# Patient Record
Sex: Female | Born: 1980 | Race: White | Hispanic: Yes | Marital: Single | Smoking: Never smoker
Health system: Southern US, Community
[De-identification: ages and names within clinical notes are randomized; demographics above are authoritative.]

## PROBLEM LIST (undated history)

## (undated) HISTORY — PX: NO PAST SURGERIES: SHX2092

---

## 2007-09-11 ENCOUNTER — Ambulatory Visit: Payer: Self-pay | Admitting: Obstetrics & Gynecology

## 2010-12-29 LAB — POCT PREGNANCY, URINE: Preg Test, Ur: NEGATIVE

## 2013-07-15 ENCOUNTER — Encounter (HOSPITAL_COMMUNITY): Payer: Self-pay | Admitting: Emergency Medicine

## 2013-07-15 ENCOUNTER — Emergency Department (HOSPITAL_COMMUNITY)
Admission: EM | Admit: 2013-07-15 | Discharge: 2013-07-15 | Disposition: A | Discharge status: Home / Self Care | Payer: Self-pay | Attending: Emergency Medicine | Admitting: Emergency Medicine

## 2013-07-15 DIAGNOSIS — J029 Acute pharyngitis, unspecified: Secondary | ICD-10-CM | POA: Insufficient documentation

## 2013-07-15 LAB — RAPID STREP SCREEN (MED CTR MEBANE ONLY): STREPTOCOCCUS, GROUP A SCREEN (DIRECT): NEGATIVE

## 2013-07-15 MED ORDER — DEXTROMETHORPHAN POLISTIREX 30 MG/5ML PO LQCR: 30.0000 mg | ORAL | Status: DC | PRN

## 2013-07-15 MED ORDER — DIPHENHYDRAMINE HCL 25 MG PO TABS: 25.0000 mg | ORAL_TABLET | Freq: Four times a day (QID) | ORAL | Status: DC | PRN

## 2013-07-15 NOTE — ED Provider Notes (Signed)
CSN: [id number]     Arrival date & time 07/15/13  1040 History  This chart was scribed for non-physician practitioner Emilia BeckKaitlyn Jamyah Folk, PA-C working with Hilario Quarryanielle S Ray, MD by Leone PayorSonum Patel, ED Scribe. This patient was seen in room TR10C/TR10C and the patient's care was started at 11:08 AM.      Chief Complaint  Patient presents with  . Sore Throat  . Fever      HPI  HPI Comments: Windy Fastna Maria Garcia-Tzep is a 33 y.o. female who presents to the Emergency Department complaining of 2 weeks of gradual onset, gradually worsening, constant sore throat with associated subjective fever and cough. She reports upper abdominal pain only when coughing. She denies sick contacts. She denies nausea, vomiting, diarrhea.   History reviewed. No pertinent past medical history. History reviewed. No pertinent past surgical history. No family history on file. History  Substance Use Topics  . Smoking status: Never Smoker   . Smokeless tobacco: Not on file  . Alcohol Use: No   OB History   Grav Para Term Preterm Abortions TAB SAB Ect Mult Living                 Review of Systems  Constitutional: Positive for fever (subjective).  HENT: Positive for sore throat. Negative for trouble swallowing.   Respiratory: Positive for cough.       Allergies  Review of patient's allergies indicates no known allergies.  Home Medications   Prior to Admission medications   Not on File   BP 105/67  Pulse 79  Temp(Src) 97.5 F (36.4 C) (Oral)  Resp 18  SpO2 98%  LMP 07/04/2013 Physical Exam  Nursing note and vitals reviewed. Constitutional: She is oriented to person, place, and time. She appears well-developed and well-nourished.  HENT:  Head: Normocephalic and atraumatic.  Mouth/Throat: No oropharyngeal exudate, posterior oropharyngeal edema or posterior oropharyngeal erythema.  Cardiovascular: Normal rate.   Pulmonary/Chest: Effort normal.  Abdominal: She exhibits no distension.  Lymphadenopathy:   She has cervical adenopathy.  Neurological: She is alert and oriented to person, place, and time.  Skin: Skin is warm and dry.  Psychiatric: She has a normal mood and affect.    ED Course  Procedures (including critical care time)  DIAGNOSTIC STUDIES: Oxygen Saturation is 98% on RA, normal by my interpretation.    COORDINATION OF CARE: 11:09 AM Will test for strep throat. Discussed treatment plan with pt at bedside and pt agreed to plan.   11:40 AM Strep test is negative. Will discharge home with Benadryl.   Labs Review Labs Reviewed  RAPID STREP SCREEN  CULTURE, GROUP A STREP    Imaging Review No results found.   EKG Interpretation None      MDM   Final diagnoses:  Sore throat    11:37 AM Patient's rapid strep is negative. Patient is afebrile and remaining vitals stable. Patient likely having post nasal drip that is irritating her throat. Patient will be discharged with benadryl. Vitals stable and patient afebrile.   I personally performed the services described in this documentation, which was scribed in my presence. The recorded information has been reviewed and is accurate.      Emilia BeckKaitlyn Sadrac Zeoli, PA-C 07/15/13 1142

## 2013-07-15 NOTE — Discharge Instructions (Signed)
Take benadryl for congestion. Take delsym for cough. Refer to attached documents for more information.   Tome benadryl para la congestin . Tome Delsym para la tos . Consulte los documentos adjuntos para ms informacin.

## 2013-07-15 NOTE — ED Notes (Signed)
Pt is here with sorethroat for 2 weeks and states that hard to sleep and has fever.  Pt now not wanting to eat or drink.  Pt states cough and sore in upper abdomen with coughing.  No vomiting or diarrhea

## 2013-07-16 NOTE — ED Provider Notes (Signed)
History/physical exam/procedure(s) were performed by non-physician practitioner and as supervising physician I was immediately available for consultation/collaboration. I have reviewed all notes and am in agreement with care and plan.   Leyana Whidden S Merek Niu, MD 07/16/13 1130 

## 2013-07-17 LAB — CULTURE, GROUP A STREP

## 2019-09-07 ENCOUNTER — Inpatient Hospital Stay (HOSPITAL_COMMUNITY)
Admission: EM | Admit: 2019-09-07 | Discharge: 2019-09-08 | Disposition: A | Discharge status: Home / Self Care | Payer: Self-pay | Attending: Obstetrics and Gynecology | Admitting: Obstetrics and Gynecology

## 2019-09-07 ENCOUNTER — Other Ambulatory Visit: Payer: Self-pay

## 2019-09-07 DIAGNOSIS — O209 Hemorrhage in early pregnancy, unspecified: Secondary | ICD-10-CM | POA: Insufficient documentation

## 2019-09-07 DIAGNOSIS — O30031 Twin pregnancy, monochorionic/diamniotic, first trimester: Secondary | ICD-10-CM | POA: Insufficient documentation

## 2019-09-07 DIAGNOSIS — N939 Abnormal uterine and vaginal bleeding, unspecified: Secondary | ICD-10-CM

## 2019-09-07 DIAGNOSIS — O34219 Maternal care for unspecified type scar from previous cesarean delivery: Secondary | ICD-10-CM | POA: Insufficient documentation

## 2019-09-07 DIAGNOSIS — O09521 Supervision of elderly multigravida, first trimester: Secondary | ICD-10-CM | POA: Insufficient documentation

## 2019-09-07 DIAGNOSIS — O3680X2 Pregnancy with inconclusive fetal viability, fetus 2: Secondary | ICD-10-CM | POA: Insufficient documentation

## 2019-09-07 DIAGNOSIS — O3680X1 Pregnancy with inconclusive fetal viability, fetus 1: Secondary | ICD-10-CM | POA: Insufficient documentation

## 2019-09-07 DIAGNOSIS — O4690 Antepartum hemorrhage, unspecified, unspecified trimester: Secondary | ICD-10-CM

## 2019-09-07 DIAGNOSIS — Z3A01 Less than 8 weeks gestation of pregnancy: Secondary | ICD-10-CM | POA: Insufficient documentation

## 2019-09-07 LAB — COMPREHENSIVE METABOLIC PANEL WITH GFR
ALT: 23 U/L (ref 0–44)
AST: 22 U/L (ref 15–41)
Albumin: 3.8 g/dL (ref 3.5–5.0)
Alkaline Phosphatase: 67 U/L (ref 38–126)
Anion gap: 9 (ref 5–15)
BUN: 11 mg/dL (ref 6–20)
CO2: 24 mmol/L (ref 22–32)
Calcium: 9 mg/dL (ref 8.9–10.3)
Chloride: 104 mmol/L (ref 98–111)
Creatinine, Ser: 0.64 mg/dL (ref 0.44–1.00)
GFR calc Af Amer: >60 mL/min
GFR calc non Af Amer: >60 mL/min
Glucose, Bld: 98 mg/dL (ref 70–99)
Potassium: 3.8 mmol/L (ref 3.5–5.1)
Sodium: 137 mmol/L (ref 135–145)
Total Bilirubin: 0.2 mg/dL — ABNORMAL LOW (ref 0.3–1.2)
Total Protein: 7 g/dL (ref 6.5–8.1)

## 2019-09-07 LAB — CBC
HCT: 39.4 % (ref 36.0–46.0)
Hemoglobin: 13 g/dL (ref 12.0–15.0)
MCH: 31.3 pg (ref 26.0–34.0)
MCHC: 33 g/dL (ref 30.0–36.0)
MCV: 94.9 fL (ref 80.0–100.0)
Platelets: 267 10*3/uL (ref 150–400)
RBC: 4.15 MIL/uL (ref 3.87–5.11)
RDW: 14 % (ref 11.5–15.5)
WBC: 10.2 10*3/uL (ref 4.0–10.5)
nRBC: 0 % (ref 0.0–0.2)

## 2019-09-07 LAB — HCG, QUANTITATIVE, PREGNANCY: hCG, Beta Chain, Quant, S: 14838 m[IU]/mL — ABNORMAL HIGH (ref <5)

## 2019-09-07 NOTE — ED Provider Notes (Signed)
MSE was initiated and I personally evaluated the patient and placed orders (if any) at  11:46 PM on September 07, 2019.  The patient appears stable so that the remainder of the MSE may be completed by another provider.  Cynthia York is a 39 y.o. female G3P1 approx 10W presents for lower abd cramping and vaginal bleeding onset this morning.  Has not yet established with OB/GYN.  Has an appointment with Dr. Shea Evans tomorrow.    Face to face Exam:   General: Awake  HEENT: Atraumatic  Resp: Normal effort  Abd: Nondistended, tender in the suprapubic region    BP 105/73 (BP Location: Left Arm)   Pulse 73   Temp 98.4 F (36.9 C) (Oral)   Resp 18   SpO2 99%    11:50 PM Discussed with [name], MAU APP who accepts in transfer.    Vaginal bleeding in pregnancy       [name] 09/07/19 2350    Zadie Rhine, MD 09/08/19 [phone]

## 2019-09-07 NOTE — ED Triage Notes (Signed)
Pt reports she is [redacted] weeks pregnant, started having some vaginal bleeding one week ago.  Yesterday she began having heavy bleeding (with clotting).  Today she continues to have heavy bleeding accompanied w/ abdominal cramping and back pain.  Last cycle started on March 28, first Dr. Appointment is scheduled for tomorrow.

## 2019-09-08 ENCOUNTER — Encounter (HOSPITAL_COMMUNITY): Payer: Self-pay

## 2019-09-08 ENCOUNTER — Inpatient Hospital Stay (HOSPITAL_COMMUNITY): Payer: Self-pay

## 2019-09-08 DIAGNOSIS — N939 Abnormal uterine and vaginal bleeding, unspecified: Secondary | ICD-10-CM

## 2019-09-08 DIAGNOSIS — O3680X2 Pregnancy with inconclusive fetal viability, fetus 2: Secondary | ICD-10-CM

## 2019-09-08 DIAGNOSIS — O30031 Twin pregnancy, monochorionic/diamniotic, first trimester: Secondary | ICD-10-CM

## 2019-09-08 DIAGNOSIS — O4690 Antepartum hemorrhage, unspecified, unspecified trimester: Secondary | ICD-10-CM

## 2019-09-08 DIAGNOSIS — Z3A10 10 weeks gestation of pregnancy: Secondary | ICD-10-CM

## 2019-09-08 DIAGNOSIS — O3680X1 Pregnancy with inconclusive fetal viability, fetus 1: Secondary | ICD-10-CM

## 2019-09-08 LAB — ABO/RH: ABO/RH(D): O POS

## 2019-09-08 LAB — WET PREP, GENITAL
Clue Cells Wet Prep HPF POC: NONE SEEN
Sperm: NONE SEEN
Trich, Wet Prep: NONE SEEN
Yeast Wet Prep HPF POC: NONE SEEN

## 2019-09-08 LAB — GC/CHLAMYDIA PROBE AMP (~~LOC~~) NOT AT ARMC
Chlamydia: NEGATIVE
Comment: NEGATIVE
Comment: NORMAL
Neisseria Gonorrhea: NEGATIVE

## 2019-09-08 NOTE — MAU Note (Signed)
Had some spotting for 1 day last week.  Today started having a moderate amount of bleeding with clots, also having abdominal pain that feels like period cramps.  Pain is more on the left side of her abdomen.

## 2019-09-08 NOTE — MAU Provider Note (Signed)
History     CSN: [id number]  Arrival date and time: 09/07/19 1932   First Provider Initiated Contact with Patient 09/08/19 0148      Chief Complaint  Patient presents with  . Vaginal Bleeding  . abdominal bleeding - [redacted] weeks pregnant   Cynthia York is a 39 y.o. G3P2 at [redacted]w[redacted]d by LMP who presents to MAU with complaints of vaginal bleeding and abdominal pain. Patient reports that symptoms started occurring this morning. Describes the abdominal pain as lower abdominal cramping that is specific to left lower quadrant. Rates pain 5/10 - has not taken any medication for abdominal pain. She describes vaginal bleeding as bright red spotting when she wipes, she denies having to wear a pad or panty liner for bleeding. She is scheduled to see Dr Micah Noel for initial visit this afternoon.    OB History    Gravida  3   Para  2   Term  2   Preterm      AB      Living  2     SAB      TAB      Ectopic      Multiple      Live Births  2           History reviewed. No pertinent past medical history.  Past Surgical History:  Procedure Laterality Date  . CESAREAN SECTION      No family history on file.  Social History   Tobacco Use  . Smoking status: Never Smoker  Substance Use Topics  . Alcohol use: No  . Drug use: No    Allergies: No Known Allergies  Medications Prior to Admission  Medication Sig Dispense Refill Last Dose  . dextromethorphan (DELSYM) 30 MG/5ML liquid Take 5 mLs (30 mg total) by mouth as needed for cough. 89 mL 0   . diphenhydrAMINE (BENADRYL) 25 MG tablet Take 1 tablet (25 mg total) by mouth every 6 (six) hours as needed for itching (Rash). 30 tablet 0     Review of Systems  Constitutional: Negative.   Respiratory: Negative.   Cardiovascular: Negative.   Gastrointestinal: Positive for abdominal pain. Negative for constipation, diarrhea, nausea and vomiting.  Genitourinary: Positive for vaginal bleeding. Negative for difficulty urinating,  dysuria, frequency, pelvic pain and urgency.  Musculoskeletal: Negative.   Neurological: Negative.   Psychiatric/Behavioral: Negative.    Physical Exam   Blood pressure 103/71, pulse 92, temperature 99.2 F (37.3 C), temperature source Oral, resp. rate 19, last menstrual period 06/29/2019, SpO2 100 %.  Physical Exam  Nursing note and vitals reviewed. Constitutional: She is oriented to person, place, and time. She appears well-developed and well-nourished. No distress.  Cardiovascular: Normal rate and regular rhythm.  Respiratory: Effort normal and breath sounds normal. No respiratory distress. She has no wheezes.  GI: Soft. She exhibits no distension. There is abdominal tenderness. There is guarding. There is no rebound.  Genitourinary:    Vaginal bleeding present.  There is bleeding in the vagina.    Genitourinary Comments: Pelvic exam: Cervix pink, visually closed, without lesion, small amount of bright red mucousy vaginal bleeding present from cervical os, vaginal walls and external genitalia normal Bimanual exam: Cervix 0/long/high, firm, anterior, neg CMT, uterus nontender, nonenlarged, right adnexa without tenderness, enlargement, or mass,  Left adnexa with extreme tenderness with palpation, no enlargement, or mass palpated    Musculoskeletal:        General: No edema. Normal range of motion.  Neurological:  She is alert and oriented to person, place, and time.  Psychiatric: She has a normal mood and affect. Her behavior is normal. Thought content normal.    MAU Course  Procedures  MDM Orders Placed This Encounter  Procedures  . Wet prep, genital  . US OB LESS THAN 14 WEEKS WITH OB TRANSVAGINAL  . US OB Comp AddL Gest Less 14 Wks  . US OB Transvaginal  . hCG, quantitative, pregnancy  . Comprehensive metabolic panel  . CBC  . Diet NPO time specified  . ABO/Rh   Labs and [place] report reviewed:  Results for orders placed or performed during the hospital encounter of 09/07/19  (from the past 24 hour(s))  hCG, quantitative, pregnancy     Status: Abnormal   Collection Time: 09/07/19  8:52 PM  Result Value Ref Range   hCG, Beta Chain, Quant, S 14,838 (H) <5 mIU/mL  Comprehensive metabolic panel     Status: Abnormal   Collection Time: 09/07/19  8:53 PM  Result Value Ref Range   Sodium 137 135 - 145 mmol/L   Potassium 3.8 3.5 - 5.1 mmol/L   Chloride 104 98 - 111 mmol/L   CO2 24 22 - 32 mmol/L   Glucose, Bld 98 70 - 99 mg/dL   BUN 11 6 - 20 mg/dL   Creatinine, Ser 6.54 0.44 - 1.00 mg/dL   Calcium 9.0 8.9 - 65.0 mg/dL   Total Protein 7.0 6.5 - 8.1 g/dL   Albumin 3.8 3.5 - 5.0 g/dL   AST 22 15 - 41 U/L   ALT 23 0 - 44 U/L   Alkaline Phosphatase 67 38 - 126 U/L   Total Bilirubin 0.2 (L) 0.3 - 1.2 mg/dL   GFR calc non Af Amer >60 >60 mL/min   GFR calc Af Amer >60 >60 mL/min   Anion gap 9 5 - 15  CBC     Status: None   Collection Time: 09/07/19  8:53 PM  Result Value Ref Range   WBC 10.2 4.0 - 10.5 K/uL   RBC 4.15 3.87 - 5.11 MIL/uL   Hemoglobin 13.0 12.0 - 15.0 g/dL   HCT 35.4 65.6 - 81.2 %   MCV 94.9 80.0 - 100.0 fL   MCH 31.3 26.0 - 34.0 pg   MCHC 33.0 30.0 - 36.0 g/dL   RDW 75.1 70.0 - 17.4 %   Platelets 267 150 - 400 K/uL   nRBC 0.0 0.0 - 0.2 %  ABO/Rh     Status: None   Collection Time: 09/08/19 12:35 AM  Result Value Ref Range   ABO/RH(D) O POS    No rh immune globuloin      NOT A RH IMMUNE GLOBULIN CANDIDATE, PT RH POSITIVE Performed at [facility], [address] [place], [place] [address]   Wet prep, genital     Status: Abnormal   Collection Time: 09/08/19  1:58 AM  Result Value Ref Range   Yeast Wet Prep HPF POC NONE SEEN NONE SEEN   Trich, Wet Prep NONE SEEN NONE SEEN   Clue Cells Wet Prep HPF POC NONE SEEN NONE SEEN   WBC, Wet Prep HPF POC MANY (A) NONE SEEN   Sperm NONE SEEN    US OB Comp AddL Gest Less 14 Wks  Result Date: 09/08/2019 CLINICAL DATA:  Pregnant patient in first-trimester pregnancy with vaginal spotting.  EXAM: TWIN OBSTETRIC <14WK [place] AND TRANSVAGINAL OB US COMPARISON:  None. FINDINGS: Number of IUPs:  2 Chorionicity/Amnionicity:  Monochorionic-diamniotic (thin membrane) TWIN 1 Yolk sac:  Visualized. Embryo:  Visualized. Cardiac Activity: Not Visualized. CRL:  2.6 mm   5 w 5 d                  [place] EDC: 05/05/2020 TWIN 2 Yolk sac:  Visualized. Embryo:  Visualized. Cardiac Activity: Not Visualized. CRL:  2.8 mm   5 w 5 d                  [place] EDC: 05/05/2020 Subchorionic hemorrhage:  None visualized. Maternal uterus/adnexae: The right ovary is normal. The left ovary is not seen. There is no pelvic free fluid or adnexal mass. IMPRESSION: 1. Twin intrauterine pregnancy (likely mono chorionic diamniotic) with 2 yolk sacs and fetal poles. No fetal cardiac activity detected within either fetal pole. Crown-rump length of 2.6 and 2.8 cm. Findings are suspicious but not yet definitive for failed pregnancy. Recommend follow-up US in 10-14 days for definitive diagnosis. This recommendation follows SRU consensus guidelines: Diagnostic Criteria for Nonviable Pregnancy Early in the First Trimester. Malva Limes Med 2013; 315:4008-67. 2. No subchorionic hemorrhage. Electronically Signed   By: Narda Rutherford M.D.   On: 09/08/2019 01:52   US OB LESS THAN 14 WEEKS WITH OB TRANSVAGINAL  Result Date: 09/08/2019 CLINICAL DATA:  Pregnant patient in first-trimester pregnancy with vaginal spotting. EXAM: TWIN OBSTETRIC <14WK [place] AND TRANSVAGINAL OB US COMPARISON:  None. FINDINGS: Number of IUPs:  2 Chorionicity/Amnionicity:  Monochorionic-diamniotic (thin membrane) TWIN 1 Yolk sac:  Visualized. Embryo:  Visualized. Cardiac Activity: Not Visualized. CRL:  2.6 mm   5 w 5 d                  [place] EDC: 05/05/2020 TWIN 2 Yolk sac:  Visualized. Embryo:  Visualized. Cardiac Activity: Not Visualized. CRL:  2.8 mm   5 w 5 d                  [place] EDC: 05/05/2020 Subchorionic hemorrhage:  None visualized. Maternal uterus/adnexae: The right ovary is normal. The  left ovary is not seen. There is no pelvic free fluid or adnexal mass. IMPRESSION: 1. Twin intrauterine pregnancy (likely mono chorionic diamniotic) with 2 yolk sacs and fetal poles. No fetal cardiac activity detected within either fetal pole. Crown-rump length of 2.6 and 2.8 cm. Findings are suspicious but not yet definitive for failed pregnancy. Recommend follow-up US in 10-14 days for definitive diagnosis. This recommendation follows SRU consensus guidelines: Diagnostic Criteria for Nonviable Pregnancy Early in the First Trimester. Malva Limes Med 2013; 619:5093-26. 2. No subchorionic hemorrhage. Electronically Signed   By: Narda Rutherford M.D.   On: 09/08/2019 01:52   Discussed results of [place] and lab work with patient. Educated and discussed miscarriage precautions based on ultrasound and no SCH noted. Discussed importance of follow up US to assess viability, [place] ordered for scheduling. Discussed with patient changing of dates based on today's [place], [facility] 05/05/20.   Discussed reasons to return to MAU. Follow up as scheduled in the office. Return to MAU as needed. Pt stable at time of discharge. Miscarriage precautions.   Assessment and Plan   1. Vaginal bleeding in pregnancy   2. Vaginal spotting   3. Monochorionic diamniotic twin gestation in first trimester   4. Pregnancy with uncertain fetal viability, fetus 1 of multiple gestation   5. Pregnancy with uncertain fetal viability, fetus 2 of multiple gestation   6. [redacted] weeks gestation of pregnancy  Discharge home Follow up as scheduled in the office for prenatal care Return to MAU as needed for reasons discussed and/or emergencies  Miscarriage precautions   Follow-up Information    [facility] Follow up.   Specialty: Obstetrics and Gynecology Why: return to MAU as needed for reasons discussed  regresar a MAU segn sea necesario por las razones discutidas Contact information: [id number] [address] [phone]         Allergies as of 09/08/2019   No Known Allergies     Medication List    TAKE these medications   dextromethorphan 30 MG/5ML liquid Commonly known as: Delsym Take 5 mLs (30 mg total) by mouth as needed for cough.   diphenhydrAMINE 25 MG tablet Commonly known as: Benadryl Take 1 tablet (25 mg total) by mouth every 6 (six) hours as needed for itching (Rash).        Sharyon Cable CNM 09/08/2019, 2:04 AM

## 2019-09-15 ENCOUNTER — Other Ambulatory Visit: Payer: Self-pay

## 2019-09-15 ENCOUNTER — Inpatient Hospital Stay (HOSPITAL_COMMUNITY): Payer: Self-pay

## 2019-09-15 ENCOUNTER — Encounter (HOSPITAL_COMMUNITY): Payer: Self-pay | Admitting: *Deleted

## 2019-09-15 ENCOUNTER — Inpatient Hospital Stay (HOSPITAL_COMMUNITY)
Admission: AD | Admit: 2019-09-15 | Discharge: 2019-09-15 | Disposition: A | Discharge status: Home / Self Care | Payer: Self-pay | Attending: Obstetrics & Gynecology | Admitting: Obstetrics & Gynecology

## 2019-09-15 DIAGNOSIS — O034 Incomplete spontaneous abortion without complication: Secondary | ICD-10-CM

## 2019-09-15 DIAGNOSIS — O30001 Twin pregnancy, unspecified number of placenta and unspecified number of amniotic sacs, first trimester: Secondary | ICD-10-CM

## 2019-09-15 DIAGNOSIS — Z3A01 Less than 8 weeks gestation of pregnancy: Secondary | ICD-10-CM | POA: Insufficient documentation

## 2019-09-15 DIAGNOSIS — O09521 Supervision of elderly multigravida, first trimester: Secondary | ICD-10-CM | POA: Insufficient documentation

## 2019-09-15 LAB — CBC
HCT: 42 % (ref 36.0–46.0)
Hemoglobin: 14 g/dL (ref 12.0–15.0)
MCH: 31.2 pg (ref 26.0–34.0)
MCHC: 33.3 g/dL (ref 30.0–36.0)
MCV: 93.5 fL (ref 80.0–100.0)
Platelets: 251 10*3/uL (ref 150–400)
RBC: 4.49 MIL/uL (ref 3.87–5.11)
RDW: 13.6 % (ref 11.5–15.5)
WBC: 8.6 10*3/uL (ref 4.0–10.5)
nRBC: 0 % (ref 0.0–0.2)

## 2019-09-15 LAB — HCG, QUANTITATIVE, PREGNANCY: hCG, Beta Chain, Quant, S: 9111 m[IU]/mL — ABNORMAL HIGH (ref <5)

## 2019-09-15 MED ORDER — IBUPROFEN 600 MG PO TABS: 600.0000 mg | ORAL_TABLET | Freq: Four times a day (QID) | ORAL | 3 refills | Status: AC | PRN

## 2019-09-15 MED ORDER — HYDROMORPHONE HCL 1 MG/ML IJ SOLN
0.5000 mg | Freq: Once | INTRAMUSCULAR | Status: AC
  Administered 2019-09-15: 0.5 mg via INTRAVENOUS
  Filled 2019-09-15: qty 1

## 2019-09-15 MED ORDER — HYDROMORPHONE HCL 1 MG/ML IJ SOLN
1.0000 mg | Freq: Once | INTRAMUSCULAR | Status: AC
  Administered 2019-09-15: 1 mg via INTRAVENOUS
  Filled 2019-09-15: qty 1

## 2019-09-15 MED ORDER — OXYCODONE HCL 5 MG PO TABS: 5.0000 mg | ORAL_TABLET | Freq: Three times a day (TID) | ORAL | 0 refills | Status: AC | PRN

## 2019-09-15 MED ORDER — KETOROLAC TROMETHAMINE 30 MG/ML IJ SOLN
30.0000 mg | Freq: Once | INTRAMUSCULAR | Status: AC
  Administered 2019-09-15: 30 mg via INTRAVENOUS
  Filled 2019-09-15: qty 1

## 2019-09-15 MED ORDER — MISOPROSTOL 200 MCG PO TABS
800.0000 ug | ORAL_TABLET | Freq: Once | ORAL | Status: AC
  Administered 2019-09-15: 800 ug via ORAL
  Filled 2019-09-15: qty 4

## 2019-09-15 NOTE — ED Triage Notes (Signed)
The pt reports that she is pregnant and has been on bed rest  For several days tonight she had vaginal bleeding and  Much pain  Crying and moaning loudly  lmp march

## 2019-09-15 NOTE — ED Provider Notes (Signed)
MSE was initiated and I personally evaluated the patient and placed orders (if any) at  1:19 AM on September 15, 2019.  Patient is G3P2 here with severe lower abdominal pain and heavy vaginal bleeding that started tonight. Seen on 09/07/19 for low abdominal pain, had an [place] that showed twin pregnancy, no cardiac activity at that time, [redacted]w[redacted]d. Has not started prenatal care yet.   VS reported to me as normal, not yet documented.  I spoke to Dr. Despina Hidden at [facility] who accepts the patient for further care.   The patient appears stable so that the remainder of the MSE may be completed by another provider.   Elpidio Anis, PA-C 09/15/19 0121    Nira Conn, MD 09/16/19 0800

## 2019-09-15 NOTE — MAU Provider Note (Addendum)
History     CSN: [id number]  Arrival date and time: 09/15/19 2440   First Provider Initiated Contact with Patient 09/15/19 0201      Chief Complaint  Patient presents with   Vaginal Bleeding   HPI Cynthia York is a 39 y.o. G3P2002 at [redacted]w[redacted]d who presents from [facility] with vaginal bleeding and pain. She was seen in MAU on 6/7 and diagnosed with a twin gestation but no FHT noted in either embryo. She was scheduled for a follow up ultrasound this week. She states she started having severe abdominal pain and heavy vaginal bleeding. She rates the pain a 10/10 and has not tried anything for the pain. She reports passing large clots at home.  OB History     Gravida  3   Para  2   Term  2   Preterm      AB      Living  2      SAB      TAB      Ectopic      Multiple      Live Births  2           History reviewed. No pertinent past medical history.  Past Surgical History:  Procedure Laterality Date   CESAREAN SECTION      No family history on file.  Social History   Tobacco Use   Smoking status: Never Smoker   Smokeless tobacco: Never Used  Substance Use Topics   Alcohol use: No   Drug use: No    Allergies: No Known Allergies  Medications Prior to Admission  Medication Sig Dispense Refill Last Dose   dextromethorphan (DELSYM) 30 MG/5ML liquid Take 5 mLs (30 mg total) by mouth as needed for cough. 89 mL 0    diphenhydrAMINE (BENADRYL) 25 MG tablet Take 1 tablet (25 mg total) by mouth every 6 (six) hours as needed for itching (Rash). 30 tablet 0     Review of Systems  Constitutional: Negative.  Negative for fatigue and fever.  HENT: Negative.   Respiratory: Negative.  Negative for shortness of breath.   Cardiovascular: Negative.  Negative for chest pain.  Gastrointestinal: Positive for abdominal pain. Negative for constipation, diarrhea, nausea and vomiting.  Genitourinary: Positive for vaginal bleeding. Negative for dysuria.  Neurological:  Negative.  Negative for dizziness and headaches.   Physical Exam   Blood pressure (!) 111/56, pulse 73, temperature 98.1 F (36.7 C), temperature source Oral, resp. rate 20, height 4\' 11"  (1.499 m), weight 72.6 kg, last menstrual period 06/29/2019, SpO2 98 %.  Physical Exam  Nursing note and vitals reviewed. Constitutional: She is oriented to person, place, and time. She appears well-developed. No distress.  HENT:  Head: Normocephalic.  Eyes: Pupils are equal, round, and reactive to light.  Cardiovascular: Normal rate, regular rhythm and normal heart sounds.  Respiratory: Effort normal and breath sounds normal. No respiratory distress.  GI: Soft. Bowel sounds are normal. She exhibits no distension. There is no abdominal tenderness.  Genitourinary:    Genitourinary Comments: SSE: large amount of bright red blood in vaginal vault with small clots, no active bleeding from os, POC in cervical os   Neurological: She is alert and oriented to person, place, and time.  Skin: Skin is warm and dry.  Psychiatric: Her behavior is normal. Judgment and thought content normal.    MAU Course  Procedures Results for orders placed or performed during the hospital encounter of 09/15/19 (from the past  24 hour(s))  CBC     Status: None   Collection Time: 09/15/19  1:25 AM  Result Value Ref Range   WBC 8.6 4.0 - 10.5 K/uL   RBC 4.49 3.87 - 5.11 MIL/uL   Hemoglobin 14.0 12.0 - 15.0 g/dL   HCT 17.6 36 - 46 %   MCV 93.5 80.0 - 100.0 fL   MCH 31.2 26.0 - 34.0 pg   MCHC 33.3 30.0 - 36.0 g/dL   RDW 16.0 73.7 - 10.6 %   Platelets 251 150 - 400 K/uL   nRBC 0.0 0.0 - 0.2 %  hCG, quantitative, pregnancy     Status: Abnormal   Collection Time: 09/15/19  1:25 AM  Result Value Ref Range   hCG, Beta Chain, Quant, S 9,111 (H) <5 mIU/mL   US OB Transvaginal  Result Date: 09/15/2019 CLINICAL DATA:  Vaginal bleeding and first-trimester pregnancy EXAM: US OB TRANSVAGINAL COMPARISON:  09/07/2012 FINDINGS: Number  of IUPs:  2 Chorionicity/Amnionicity:  Monochorionic-diamniotic (thin membrane) TWIN 1 Yolk sac:  Visualized. Embryo:  Visualized. Cardiac Activity: Not Visualized. CRL:  3.2 mm   5 w 6 d TWIN 2 Yolk sac:  Visualized. Embryo:  Visualized. Cardiac Activity: Not Visualized. CRL:  3 mm   5 w 5 d Subchorionic hemorrhage: The gestational sac now has an elongated appearance and there is echogenic material distending the cervical canal/lower uterine segment. Maternal uterus/adnexae: Negative IMPRESSION: Twin pregnancy with poor growth since a scan 1 week ago. There is also a prominent degree of blood clot distending the lower uterine segment/cervix and the gestational sac has become more flattened. Findings are worrisome for pregnancy failure. Electronically Signed   By: Marnee Spring M.D.   On: 09/15/2019 04:15   MDM CBC, HCG 1mg  dilaudid IV Small amount of POC able to be removed from cervical os, but more still visualized.   OB Transvaginal 1mg  dialudid IV  Reviewed results with patient at length. Reviewed dropping HCG, abnormal shape in gestational sac, lack of growth of pregnancy or FHT, clots in uterus and significant bleeding tonight all signs of miscarriage. Discussed option to d/c home and monitor pregnancy development closely but given active bleeding, this will likely continue and complete the SAB. Patient upset and states she cannot go home with pain and bleeding because she has two small children at home. Reviewed options of medical management vs surgical management. Patient desires cytotec in MAU.   Early Intrauterine Pregnancy Failure  _X_  Documented intrauterine pregnancy failure less than or equal to [redacted] weeks gestation  _X_  No serious current illness  _X_  Baseline Hgb greater than or equal to 10g/dl  _X_  Patient has easily accessible transportation to the hospital  _X_  Clear preference  _X_  Practitioner/physician deems patient reliable  _X_  Counseling by practitioner or  physician  _X_  Patient education by RN  _n/a_  Rho-Gam given by RN if indicated  _X__ Medication dispensed   _X__   Cytotec 800 mcg  __   Intravaginally by patient at home         _X_  PO by RN in MAU        __   Buccally by patient at home        __   Buccally by RN in MAU  _X_  Ibuprofen 600 mg 1 tablet by mouth every 6 hours as needed #30  _X__  Hydrocodone/acetaminophen 5/325 mg by mouth every 4 to 6 hours as needed  _X__  Phenergan 12.5 mg by mouth every 4 hours as needed for nausea   Care turned over to Tyler Aas CNM at 0800.   Rolm Bookbinder, CNM 09/15/19 8:04 AM  Reassessed at 0900, pt reported pain 3-4/10 and some cramping. Able to get up to the bathroom without dizziness, bleeding much slower, only small clots seen on pad. Larger clots pulled from cervix/introitus during spec exam.  Post speculum exam, pt complained of increased cramping, Toradol IV ordered. Pt stable to go home.   Assessment and Plan  Incomplete miscarriage  Discharge to home, follow-up with Dr. Shawnie Pons. Bleeding precautions given. Return to care (here or at [facility]) if increased pain without relief from meds, or bleeding that soaks a pad an hour.

## 2019-09-15 NOTE — Discharge Instructions (Signed)
Aborto espontneo incompleto Incomplete Miscarriage El aborto espontneo es la prdida de un beb que no ha nacido (feto) antes de la [id number] del Media planner. En un aborto espontneo incompleto, partes del feto o la placenta (alumbramiento) permanecen en el cuerpo. La mayor parte de los abortos espontneos ocurre durante los primeros 17meses de Media planner. En algunos casos, sucede antes de que la mujer sepa que est [place]. El aborto espontneo puede ser Ardelia Mems experiencia que afecte emocionalmente a Geologist, engineering. Si ha sufrido un aborto espontneo, hable con su mdico para formularle las preguntas que tenga sobre el aborto, el proceso de duelo y los planes futuros de Media planner. Cules son las causas? Esta afeccin puede ser causada por lo siguiente:  Problemas genticos o cromosmicos que impiden que el beb se desarrolle con normalidad. Estos problemas son, en general, el resultado de errores fortuitos que ocurren en la etapa temprana del desarrollo y que no se transmiten de padres a hijos (no se heredan).  Infeccin en el cuello del tero.  Trastornos que afectan el equilibrio hormonal del organismo.  Problemas en el cuello del tero, como su adelgazamiento y apertura antes de que el embarazo llegue a trmino (insuficiencia del cuello de tero).  Problemas en el tero, como un tero con forma anmala o fibromas en el tero, o problemas existentes desde el nacimiento (anormalidades congnitas).  Ciertas enfermedades crnicas.  Fumar, beber alcohol o usar drogas.  Lesiones (traumatismos). Muchas veces la causa del aborto espontneo no se conoce. Cules son los signos o los sntomas? Los sntomas de esta afeccin incluyen los siguientes:  Sangrado o manchado vaginal, con o sin clicos o dolor.  Dolor o clicos en el abdomen o en la parte inferior de la espalda.  Eliminacin de lquido, tejidos o cogulos sanguneos por la vagina. Cmo se diagnostica? Esta afeccin se puede diagnosticar  en funcin de lo siguiente:  Un examen fsico.  Ecografa.  Anlisis de [place].  Anlisis de [place]. Cmo se trata? Un aborto espontneo incompleto se puede tratar con alguno de los siguientes mtodos:  Dilatacin y curetaje (D&C). Mediante este procedimiento, se expande el cuello del tero y se raspan las paredes (endometrio) para eliminar todo resto de tejido del Media planner.  Medicamentos, por ejemplo: ? Antibiticos para tratar una infeccin. ? Medicamentos para expulsar los restos de tejido del tero. ? Medicamentos para reducir Occupational psychologist) el tamao del tero. Estos medicamentos se pueden administrar si tiene un sangrado abundante. Si su factor sanguneo es Rhnegativo y Metolius de su beb es Rhpositivo, usted Print production planner inyeccin del medicamento llamado inmunoglobulinaRhpara proteger a los bebs futuros de Best boy problemas con el factorsanguneoRh. Los trminos "Rhnegativo" y "Rhpositivo" Psychologist, prison and probation services a la presencia o no en la sangre de una protena especfica que se encuentra en la superficie de los glbulos rojos (factorRh). Siga estas indicaciones en su casa: Medicamentos   [employer] de venta libre y los recetados solamente como se lo haya indicado el mdico.  Si le recetaron antibiticos, tmelos como se lo haya indicado el mdico. No deje de tomar el antibitico aunque comience a sentirse mejor.  No tome antiinflamatorios no esteroideos (AINE), tales como aspirina e ibuprofeno, a menos que se lo indique el mdico. Estos medicamentos pueden provocarle sangrado. Actividad  Haga reposo segn lo indicado. Pregntele al mdico qu actividades son seguras para usted.  Pdale a alguien que la ayude con las responsabilidades familiares y del hogar durante este tiempo. Instrucciones generales  Lleve un registro de la cantidad y la saturacin  de las toallas higinicas que Landscape architect. Anote esta informacin.  Anote la cantidad de tejido o cogulos  sanguneos que expulsa por la vagina. Guarde las cantidades grandes de tejidos para que el [employer] examine.  No use tampones, no se haga duchas vaginales ni tenga relaciones sexuales hasta que el mdico la autorice.  Para que usted y su pareja puedan sobrellevar el proceso de duelo, hable con su mdico o busque apoyo psicolgico que los ayude a Runner, broadcasting/film/video la prdida del Psychiatrist.  Cuando est lista, visite a su mdico para hablar sobre los [employer] que deber seguir en relacin con su salud y para tener un embarazo saludable en el futuro.  Concurra a todas las visitas de [address] se lo haya indicado el mdico. Esto es importante. Dnde encontrar ms informacin  Cole[employer] [place]ense de [place] y Insurance claims handler of Obstetricians and Gynecologists): [email or web address]  Departamento de Salud y [address] de los [address], [place] de Salud de Architectural technologist (U.S. Department of Health and [employer], Office on [address]): [email or web address] Comunquese con un mdico si:  Tiene fiebre o siente escalofros.  Tiene una secrecin vaginal con mal olor. Solicite ayuda de inmediato si:  Siente calambres intensos o dolor en la espalda o en el abdomen.  Elimina cogulos sanguneos del tamao de una nuez (o ms grandes) o tejido por la vagina.  Tiene sangrado vaginal abundante y necesita ms de una toalla higinica de tamao regular por hora.  Se siente mareada o dbil.  Se desmaya.  Siente una tristeza que la invade o Archivist. Resumen  En un aborto espontneo incompleto, partes del feto o la placenta (alumbramiento) permanecen en el cuerpo.  Existen diversas opciones de tratamiento para un aborto espontneo incompleto; hable con su mdico sobre la ms [place] para usted.  Siga las indicaciones del mdico en cuanto a los cuidados posteriores.  Para que usted y su pareja puedan sobrellevar el proceso de duelo, hable con su mdico o  busque apoyo psicolgico que los ayude a Runner, broadcasting/film/video la prdida del Psychiatrist. Esta informacin no tiene Theme park manager el consejo del mdico. Asegrese de hacerle al mdico cualquier pregunta que tenga. Document Revised: 12/21/2016 Document Reviewed: 12/21/2016 Elsevier Patient Education  2020 [employer].

## 2019-09-18 ENCOUNTER — Ambulatory Visit
Admission: RE | Admit: 2019-09-18 | Discharge: 2019-09-18 | Disposition: A | Discharge status: Home / Self Care | Payer: Self-pay | Source: Ambulatory Visit | Attending: Certified Nurse Midwife | Admitting: Certified Nurse Midwife

## 2019-09-18 ENCOUNTER — Other Ambulatory Visit: Payer: Self-pay

## 2019-09-18 ENCOUNTER — Telehealth: Payer: Self-pay | Admitting: Medical

## 2019-09-18 DIAGNOSIS — O3680X1 Pregnancy with inconclusive fetal viability, fetus 1: Secondary | ICD-10-CM | POA: Insufficient documentation

## 2019-09-18 DIAGNOSIS — O30031 Twin pregnancy, monochorionic/diamniotic, first trimester: Secondary | ICD-10-CM | POA: Insufficient documentation

## 2019-09-18 DIAGNOSIS — O034 Incomplete spontaneous abortion without complication: Secondary | ICD-10-CM

## 2019-09-18 DIAGNOSIS — O3680X2 Pregnancy with inconclusive fetal viability, fetus 2: Secondary | ICD-10-CM | POA: Insufficient documentation

## 2019-09-18 MED ORDER — OXYCODONE-ACETAMINOPHEN 5-325 MG PO TABS: 1.0000 | ORAL_TABLET | Freq: Four times a day (QID) | ORAL | 0 refills | Status: AC | PRN

## 2019-09-18 MED ORDER — MISOPROSTOL 200 MCG PO TABS: 800.0000 ug | ORAL_TABLET | Freq: Once | ORAL | 0 refills | Status: AC

## 2019-09-18 NOTE — Telephone Encounter (Signed)
Called [facility] with [place] results since the [facility] was closed this afternoon. Patient received Cytotec for failed pregnancy 09/15/19 in [facility]. Today she had follow-up US at [facility] which showed clot in the uterus. The patient continues to have some bleeding and mild to moderate pain. Discussed patient with Dr. Debroah Loop, agrees with plan to re-dose Cytotec at home. Rx sent with Rx for Percocet and Ibuprofen to patient's pharmacy of choice. Bleeding precautions discussed. Patient advised to follow-up with Dr. Shawnie Pons as scheduled on Monday or sooner PRN. All questions answered.   Vonzella Nipple, PA-C 09/18/2019 3:45 PM

## 2021-02-22 ENCOUNTER — Emergency Department (HOSPITAL_COMMUNITY): Payer: No Typology Code available for payment source

## 2021-02-22 ENCOUNTER — Other Ambulatory Visit: Payer: Self-pay

## 2021-02-22 ENCOUNTER — Emergency Department (HOSPITAL_COMMUNITY)
Admission: EM | Admit: 2021-02-22 | Discharge: 2021-02-22 | Disposition: A | Discharge status: Home / Self Care | Payer: No Typology Code available for payment source | Attending: Emergency Medicine | Admitting: Emergency Medicine

## 2021-02-22 DIAGNOSIS — S299XXA Unspecified injury of thorax, initial encounter: Secondary | ICD-10-CM | POA: Diagnosis present

## 2021-02-22 DIAGNOSIS — S1093XA Contusion of unspecified part of neck, initial encounter: Secondary | ICD-10-CM | POA: Diagnosis not present

## 2021-02-22 DIAGNOSIS — S7002XA Contusion of left hip, initial encounter: Secondary | ICD-10-CM | POA: Insufficient documentation

## 2021-02-22 DIAGNOSIS — T1490XA Injury, unspecified, initial encounter: Secondary | ICD-10-CM

## 2021-02-22 DIAGNOSIS — S9001XA Contusion of right ankle, initial encounter: Secondary | ICD-10-CM | POA: Diagnosis not present

## 2021-02-22 DIAGNOSIS — Y92410 Unspecified street and highway as the place of occurrence of the external cause: Secondary | ICD-10-CM | POA: Insufficient documentation

## 2021-02-22 DIAGNOSIS — M25571 Pain in right ankle and joints of right foot: Secondary | ICD-10-CM | POA: Insufficient documentation

## 2021-02-22 DIAGNOSIS — S0990XA Unspecified injury of head, initial encounter: Secondary | ICD-10-CM | POA: Insufficient documentation

## 2021-02-22 DIAGNOSIS — S20219A Contusion of unspecified front wall of thorax, initial encounter: Secondary | ICD-10-CM | POA: Insufficient documentation

## 2021-02-22 LAB — COMPREHENSIVE METABOLIC PANEL
ALT: 24 U/L (ref 0–44)
AST: 24 U/L (ref 15–41)
Albumin: 3.8 g/dL (ref 3.5–5.0)
Alkaline Phosphatase: 60 U/L (ref 38–126)
Anion gap: 9 (ref 5–15)
BUN: 13 mg/dL (ref 6–20)
CO2: 22 mmol/L (ref 22–32)
Calcium: 9 mg/dL (ref 8.9–10.3)
Chloride: 105 mmol/L (ref 98–111)
Creatinine, Ser: 0.76 mg/dL (ref 0.44–1.00)
GFR, Estimated: >60 mL/min (ref >60)
Glucose, Bld: 100 mg/dL — ABNORMAL HIGH (ref 70–99)
Potassium: 3.6 mmol/L (ref 3.5–5.1)
Sodium: 136 mmol/L (ref 135–145)
Total Bilirubin: 0.6 mg/dL (ref 0.3–1.2)
Total Protein: 7.3 g/dL (ref 6.5–8.1)

## 2021-02-22 LAB — CBC WITH DIFFERENTIAL/PLATELET
Abs Immature Granulocytes: 0.03 10*3/uL (ref 0.00–0.07)
Basophils Absolute: 0 10*3/uL (ref 0.0–0.1)
Basophils Relative: 0 %
Eosinophils Absolute: 0.1 10*3/uL (ref 0.0–0.5)
Eosinophils Relative: 1 %
HCT: 38.9 % (ref 36.0–46.0)
Hemoglobin: 13.3 g/dL (ref 12.0–15.0)
Immature Granulocytes: 0 %
Lymphocytes Relative: 22 %
Lymphs Abs: 2.1 10*3/uL (ref 0.7–4.0)
MCH: 31 pg (ref 26.0–34.0)
MCHC: 34.2 g/dL (ref 30.0–36.0)
MCV: 90.7 fL (ref 80.0–100.0)
Monocytes Absolute: 0.6 10*3/uL (ref 0.1–1.0)
Monocytes Relative: 6 %
Neutro Abs: 6.5 10*3/uL (ref 1.7–7.7)
Neutrophils Relative %: 71 %
Platelets: 268 10*3/uL (ref 150–400)
RBC: 4.29 MIL/uL (ref 3.87–5.11)
RDW: 14 % (ref 11.5–15.5)
WBC: 9.3 10*3/uL (ref 4.0–10.5)
nRBC: 0 % (ref 0.0–0.2)

## 2021-02-22 LAB — I-STAT CHEM 8, ED
BUN: 15 mg/dL (ref 6–20)
Calcium, Ion: 1.12 mmol/L — ABNORMAL LOW (ref 1.15–1.40)
Chloride: 105 mmol/L (ref 98–111)
Creatinine, Ser: 0.7 mg/dL (ref 0.44–1.00)
Glucose, Bld: 98 mg/dL (ref 70–99)
HCT: 40 % (ref 36.0–46.0)
Hemoglobin: 13.6 g/dL (ref 12.0–15.0)
Potassium: 3.7 mmol/L (ref 3.5–5.1)
Sodium: 139 mmol/L (ref 135–145)
TCO2: 22 mmol/L (ref 22–32)

## 2021-02-22 LAB — I-STAT BETA HCG BLOOD, ED (MC, WL, AP ONLY): I-stat hCG, quantitative: <5 m[IU]/mL (ref <5)

## 2021-02-22 MED ORDER — HYDROCODONE-ACETAMINOPHEN 5-325 MG PO TABS
1.0000 | ORAL_TABLET | Freq: Once | ORAL | Status: AC
  Administered 2021-02-22: 1 via ORAL
  Filled 2021-02-22: qty 1

## 2021-02-22 MED ORDER — HYDROCODONE-ACETAMINOPHEN 5-325 MG PO TABS: 1.0000 | ORAL_TABLET | Freq: Four times a day (QID) | ORAL | 0 refills | Status: AC | PRN

## 2021-02-22 MED ORDER — OXYCODONE-ACETAMINOPHEN 5-325 MG PO TABS
1.0000 | ORAL_TABLET | Freq: Once | ORAL | Status: AC
  Administered 2021-02-22: 1 via ORAL
  Filled 2021-02-22: qty 1

## 2021-02-22 MED ORDER — IOHEXOL 300 MG/ML  SOLN
100.0000 mL | Freq: Once | INTRAMUSCULAR | Status: AC | PRN
  Administered 2021-02-22: 100 mL via INTRAVENOUS

## 2021-02-22 NOTE — Discharge Instructions (Addendum)
Follow-up with Dr. Linna Caprice for your hip and your ankle.  Use the crutches if necessary for ambulation
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
## 2021-07-25 IMAGING — US US OB < 14 WEEKS - US OB TV
1 series · 15 of 28 positions shown · non-contrast
Comparison: None.

CLINICAL DATA: Pregnant patient in first-trimester pregnancy with
vaginal spotting.

EXAM:
TWIN OBSTETRIC <14WK US AND TRANSVAGINAL OB US

[Series 1: us ob < 14 weeks - us ob tv · 45 acquisitions, 15 frames shown]
[im 1/45]
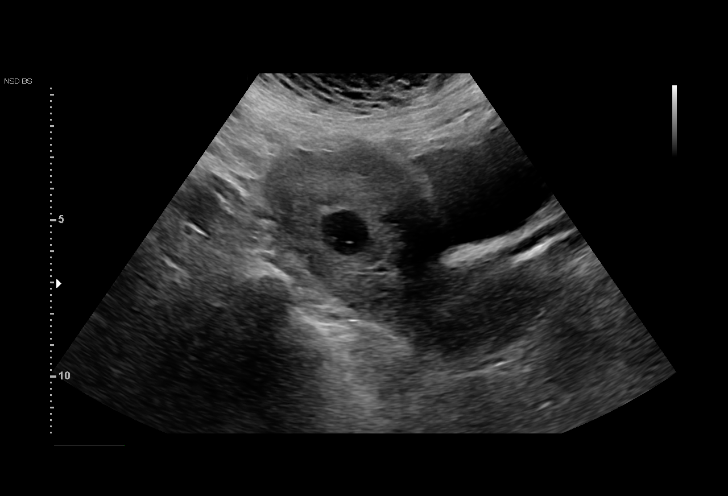
[im 4/45]
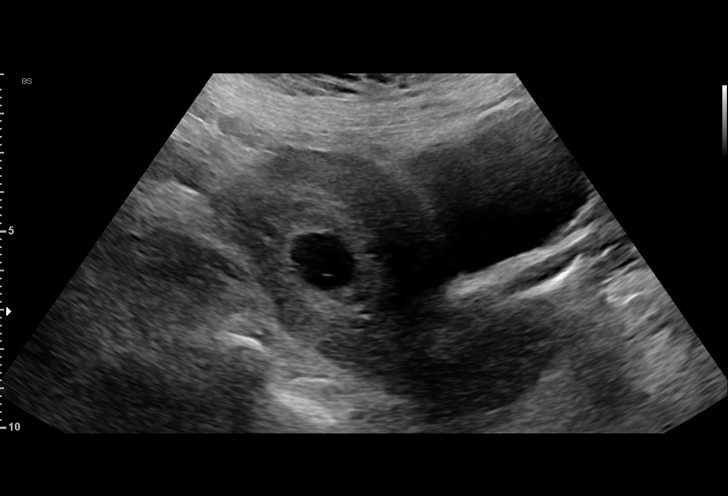
[im 7/45]
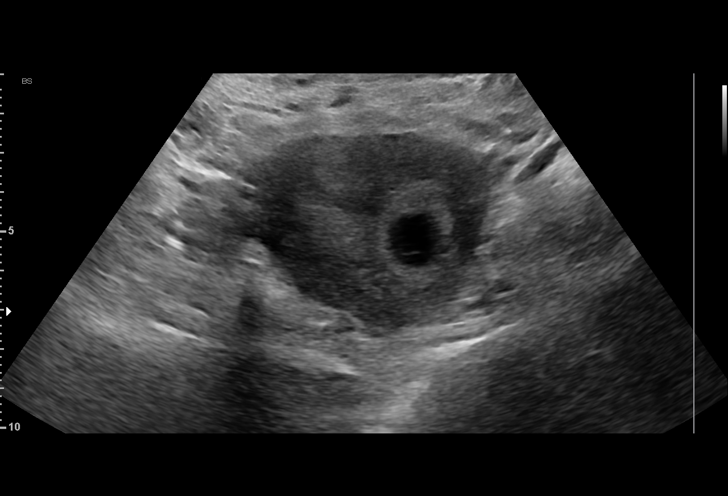
[im 10/45]
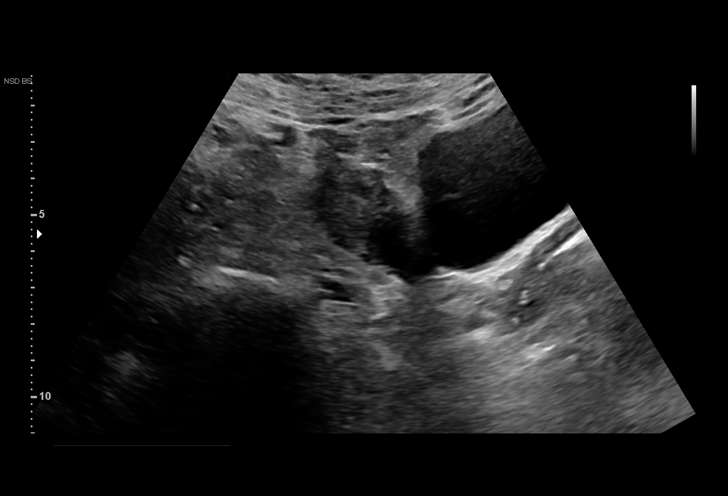
[im 14/45]
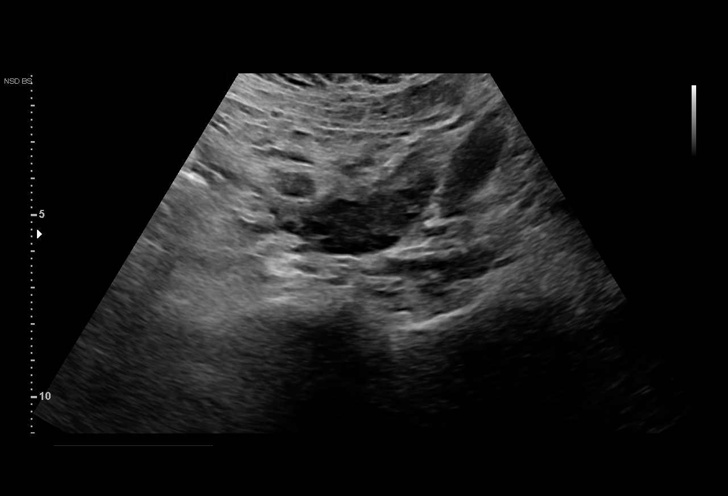
[im 17/45]
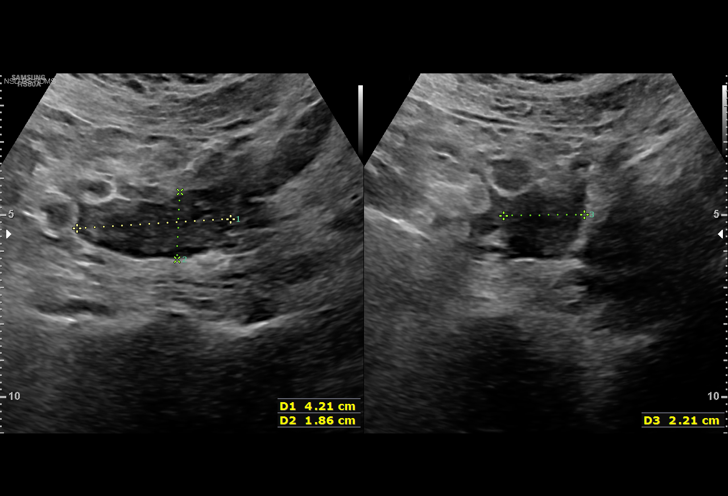
[im 20/45]
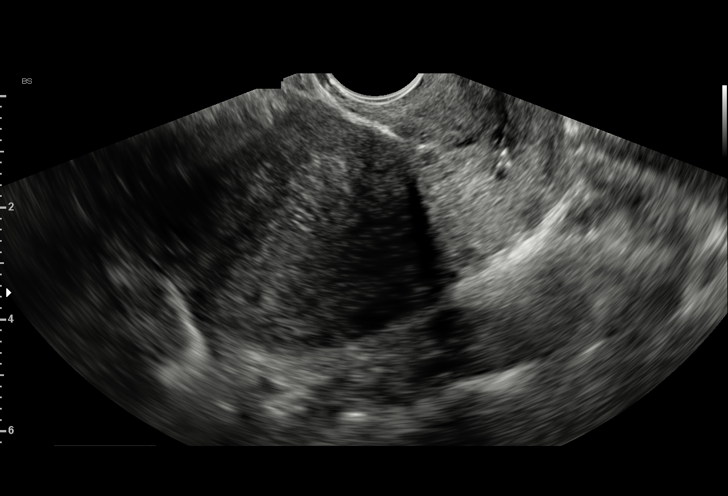
[im 23/45]
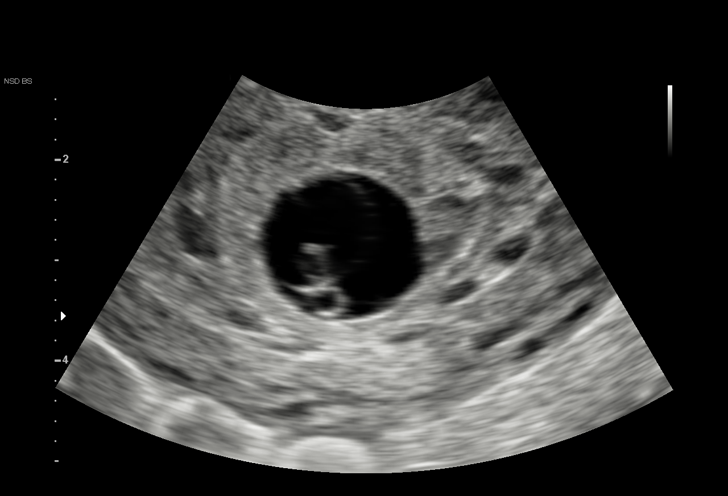
[im 25/45]
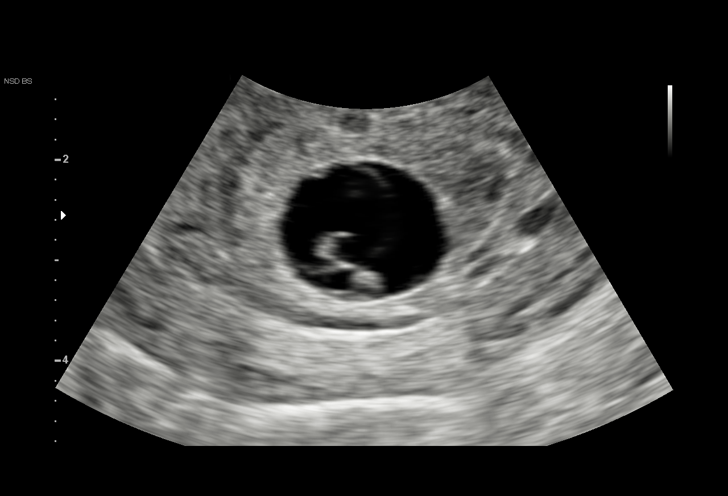
[im 28/45]
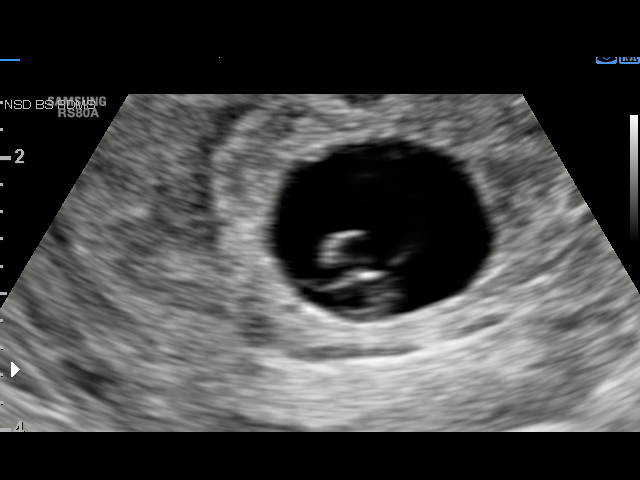
[im 31/45]
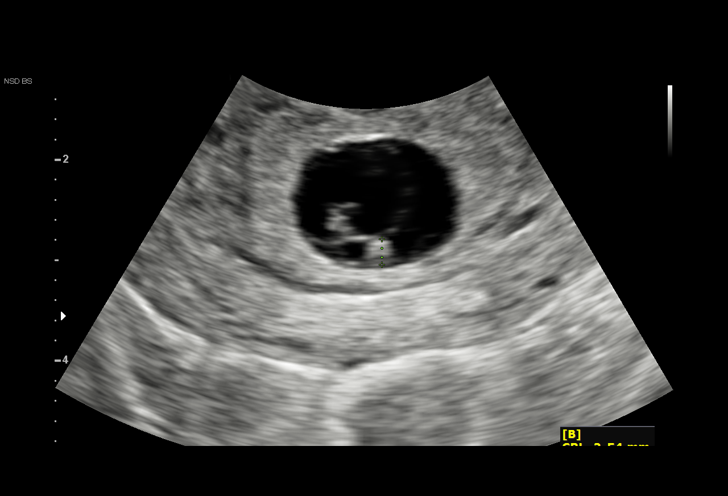
[im 35/45]
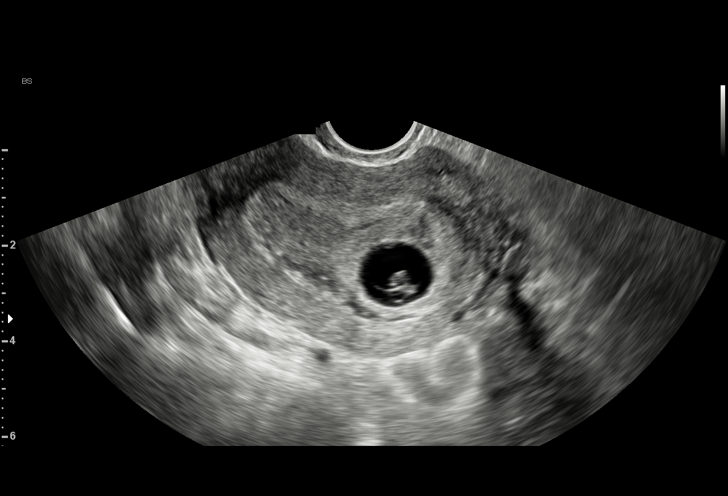
[im 38/45]
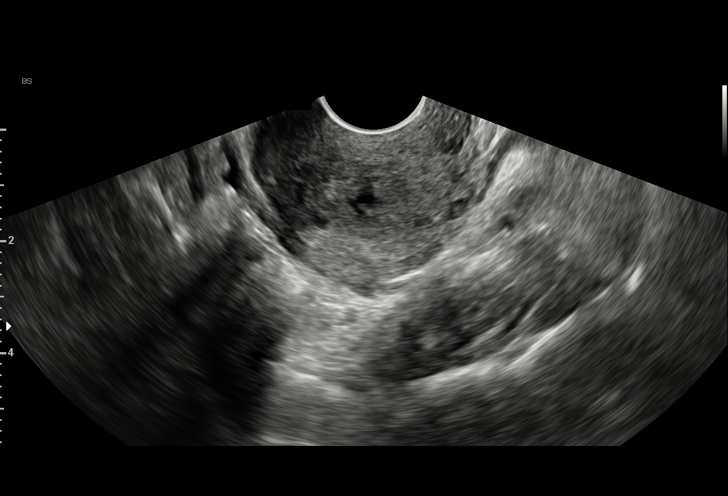
[im 41/45]
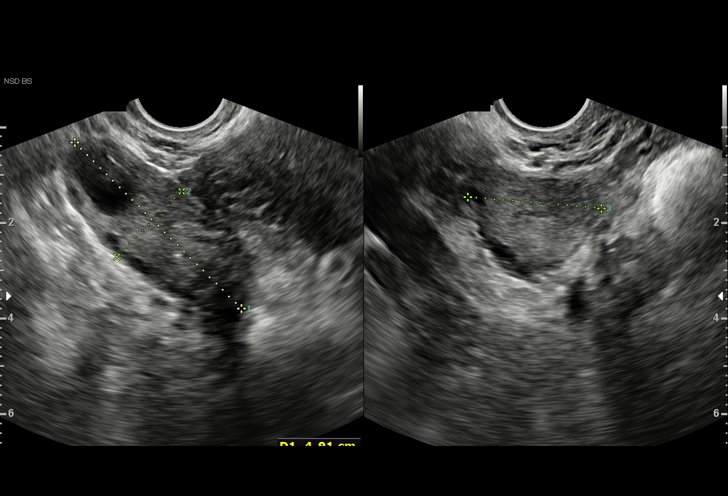
[im 45/45]
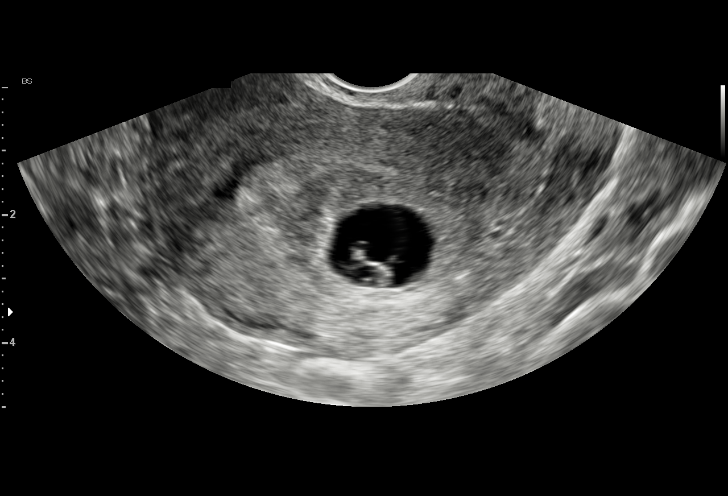

[15 of 28 positions shown; findings below may reference images not displayed]

FINDINGS: Number of IUPs:  2

Chorionicity/Amnionicity:  Monochorionic-diamniotic (thin membrane)

TWIN 1

Yolk sac:  Visualized.

Embryo:  Visualized.

Cardiac Activity: Not Visualized.

CRL:  2.6 mm   5 w 5 d                  US EDC: 05/05/2020

TWIN 2

Yolk sac:  Visualized.

Embryo:  Visualized.

Cardiac Activity: Not Visualized.

CRL:  2.8 mm   5 w 5 d                  US EDC: 05/05/2020

Subchorionic hemorrhage:  None visualized.

Maternal uterus/adnexae: The right ovary is normal. The left ovary
is not seen. There is no pelvic free fluid or adnexal mass.
IMPRESSION: 1. Twin intrauterine pregnancy (likely mono chorionic diamniotic)
with 2 yolk sacs and fetal poles. No fetal cardiac activity detected
within either fetal pole. Crown-rump length of 2.6 and 2.8 cm.
Findings are suspicious but not yet definitive for failed pregnancy.
Recommend follow-up US in 10-14 days for definitive diagnosis. This
recommendation follows SRU consensus guidelines: Diagnostic Criteria
for Nonviable Pregnancy Early in the First Trimester. N Engl J Med
2. No subchorionic hemorrhage.
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
